# Patient Record
Sex: Female | Born: 1965 | Race: White | Hispanic: No | Marital: Married | Smoking: Never smoker
Health system: Southern US, Community
[De-identification: ages and names within clinical notes are randomized; demographics above are authoritative.]

## PROBLEM LIST (undated history)

## (undated) DIAGNOSIS — Q825 Congenital non-neoplastic nevus: Secondary | ICD-10-CM

## (undated) DIAGNOSIS — Q898 Other specified congenital malformations: Secondary | ICD-10-CM

## (undated) HISTORY — DX: Other specified congenital malformations: Q89.8

## (undated) HISTORY — DX: Congenital non-neoplastic nevus: Q82.5

---

## 1997-06-04 ENCOUNTER — Inpatient Hospital Stay (HOSPITAL_COMMUNITY): Admission: AD | Admit: 1997-06-04 | Discharge: 1997-06-05 | Payer: Self-pay | Admitting: Obstetrics and Gynecology

## 1997-06-08 ENCOUNTER — Inpatient Hospital Stay (HOSPITAL_COMMUNITY): Admission: AD | Admit: 1997-06-08 | Discharge: 1997-06-10 | Payer: Self-pay | Admitting: Obstetrics & Gynecology

## 1997-06-10 ENCOUNTER — Encounter: Admission: RE | Admit: 1997-06-10 | Discharge: 1997-09-08 | Payer: Self-pay | Admitting: Obstetrics & Gynecology

## 1997-09-08 ENCOUNTER — Encounter (HOSPITAL_COMMUNITY): Admission: RE | Admit: 1997-09-08 | Discharge: 1997-12-07 | Payer: Self-pay | Admitting: *Deleted

## 1997-12-20 ENCOUNTER — Encounter (HOSPITAL_COMMUNITY): Admission: RE | Admit: 1997-12-20 | Discharge: 1998-01-16 | Payer: Self-pay | Admitting: *Deleted

## 1998-01-23 ENCOUNTER — Other Ambulatory Visit: Admission: RE | Admit: 1998-01-23 | Discharge: 1998-01-23 | Payer: Self-pay | Admitting: Obstetrics and Gynecology

## 1998-12-04 ENCOUNTER — Emergency Department (HOSPITAL_COMMUNITY): Admission: EM | Admit: 1998-12-04 | Discharge: 1998-12-04 | Payer: Self-pay | Admitting: Emergency Medicine

## 1998-12-07 ENCOUNTER — Encounter (HOSPITAL_COMMUNITY): Admission: RE | Admit: 1998-12-07 | Discharge: 1998-12-11 | Payer: Self-pay | Admitting: Emergency Medicine

## 1998-12-18 ENCOUNTER — Encounter: Admission: RE | Admit: 1998-12-18 | Discharge: 1999-01-01 | Payer: Self-pay | Admitting: Emergency Medicine

## 2000-02-22 ENCOUNTER — Other Ambulatory Visit: Admission: RE | Admit: 2000-02-22 | Discharge: 2000-02-22 | Payer: Self-pay | Admitting: *Deleted

## 2001-06-29 ENCOUNTER — Other Ambulatory Visit: Admission: RE | Admit: 2001-06-29 | Discharge: 2001-06-29 | Payer: Self-pay | Admitting: *Deleted

## 2001-07-27 ENCOUNTER — Encounter: Payer: Self-pay | Admitting: Family Medicine

## 2001-07-27 ENCOUNTER — Ambulatory Visit (HOSPITAL_COMMUNITY): Admission: RE | Admit: 2001-07-27 | Discharge: 2001-07-27 | Payer: Self-pay | Admitting: Family Medicine

## 2002-08-17 ENCOUNTER — Other Ambulatory Visit: Admission: RE | Admit: 2002-08-17 | Discharge: 2002-08-17 | Payer: Self-pay | Admitting: *Deleted

## 2003-01-08 ENCOUNTER — Emergency Department (HOSPITAL_COMMUNITY): Admission: EM | Admit: 2003-01-08 | Discharge: 2003-01-08 | Payer: Self-pay | Admitting: Emergency Medicine

## 2003-01-08 ENCOUNTER — Encounter: Payer: Self-pay | Admitting: Emergency Medicine

## 2004-07-09 ENCOUNTER — Ambulatory Visit (HOSPITAL_COMMUNITY): Admission: RE | Admit: 2004-07-09 | Discharge: 2004-07-09 | Payer: Self-pay | Admitting: Obstetrics

## 2005-05-16 ENCOUNTER — Emergency Department (HOSPITAL_COMMUNITY): Admission: EM | Admit: 2005-05-16 | Discharge: 2005-05-16 | Payer: Self-pay | Admitting: Emergency Medicine

## 2006-06-18 ENCOUNTER — Other Ambulatory Visit: Admission: RE | Admit: 2006-06-18 | Discharge: 2006-06-18 | Payer: Self-pay | Admitting: Obstetrics and Gynecology

## 2006-08-12 ENCOUNTER — Ambulatory Visit (HOSPITAL_BASED_OUTPATIENT_CLINIC_OR_DEPARTMENT_OTHER): Admission: RE | Admit: 2006-08-12 | Discharge: 2006-08-12 | Payer: Self-pay | Admitting: Obstetrics and Gynecology

## 2006-08-12 ENCOUNTER — Encounter (INDEPENDENT_AMBULATORY_CARE_PROVIDER_SITE_OTHER): Payer: Self-pay | Admitting: Specialist

## 2006-08-12 HISTORY — PX: HYSTEROSCOPY: SHX211

## 2007-06-26 ENCOUNTER — Other Ambulatory Visit: Admission: RE | Admit: 2007-06-26 | Discharge: 2007-06-26 | Payer: Self-pay | Admitting: Obstetrics and Gynecology

## 2007-07-09 ENCOUNTER — Ambulatory Visit (HOSPITAL_COMMUNITY): Admission: RE | Admit: 2007-07-09 | Discharge: 2007-07-09 | Payer: Self-pay | Admitting: Obstetrics and Gynecology

## 2008-06-29 ENCOUNTER — Other Ambulatory Visit: Admission: RE | Admit: 2008-06-29 | Discharge: 2008-06-29 | Payer: Self-pay | Admitting: Obstetrics and Gynecology

## 2008-08-18 ENCOUNTER — Ambulatory Visit (HOSPITAL_COMMUNITY): Admission: RE | Admit: 2008-08-18 | Discharge: 2008-08-18 | Payer: Self-pay | Admitting: Obstetrics and Gynecology

## 2009-09-15 ENCOUNTER — Ambulatory Visit (HOSPITAL_COMMUNITY): Admission: RE | Admit: 2009-09-15 | Discharge: 2009-09-15 | Payer: Self-pay | Admitting: Obstetrics and Gynecology

## 2009-10-22 ENCOUNTER — Ambulatory Visit: Payer: Self-pay | Admitting: Vascular Surgery

## 2009-10-22 ENCOUNTER — Ambulatory Visit (HOSPITAL_COMMUNITY): Admission: RE | Admit: 2009-10-22 | Discharge: 2009-10-22 | Payer: Self-pay | Admitting: Emergency Medicine

## 2009-10-22 ENCOUNTER — Encounter: Payer: Self-pay | Admitting: Emergency Medicine

## 2009-12-01 ENCOUNTER — Ambulatory Visit: Payer: Self-pay | Admitting: Vascular Surgery

## 2010-09-04 NOTE — Consult Note (Signed)
NEW PATIENT CONSULTATION   Whitney Flynn, Whitney Flynn  DOB:  [birth year]                                       12/01/2009  EAVWU#:[id number]   The patient presents today for evaluation of left leg swelling.  She is  a very pleasant 45 year old white female with a lifelong history of port  wine stain, more so on her left side than on her right side.  She had  some evaluation over time and has always had some swelling in her left  leg versus her right leg.  She has never had any MRI or other studies  for further evaluation of potential AV malformations.  She does not have  any evidence externally of any AV malformations.  She noted over the  past month and a half to have more pain and swelling than had been  present in the past on her left leg.  She underwent a duplex evaluation  on 10/22/2009 and this showed no evidence of DVT.  She was diagnosed  with a ruptured left Baker's cyst.  She has had some persistent of  discomfort and persistent swelling and is seen for further evaluation of  this.  She does not have any prior history of DVT.   PAST MEDICAL HISTORY:  Otherwise completely unremarkable.  She is on no  medications, no known drug allergies.  Hospitalizations only for  childbirth.  She has had no past surgery.   FAMILY HISTORY:  Negative for any premature atherosclerotic disease.   REVIEW OF SYSTEMS:  Does have muscular pain, swelling in her left leg.  Otherwise completely normal with no weight loss or gain.  Weight is 148  pounds.  She is 5 feet 7 inches tall.   PHYSICAL EXAMINATION:  General:  Well-developed, well-nourished white  female appearing her stated age of 76.  Vital signs:  Blood pressure is  127/8,1 pulse 77, respirations, 18.  HEENT is normal.  Her radial and  dorsalis pedis pulses are 2+ bilaterally.  She does not have any pitting  edema.  She does not have any swelling in her right leg with slight port  wine staining.  On the left she has much more  extensive port wine  staining and does have total leg swelling as compared to the right.  Neurologic:  No focal weakness or paresthesias.   I discussed the significance of this with the patient.  I explained that  with the negative DVT study that her swelling and would not have any  dangerous etiology.  I expect this all very well may be related to  continued residual from the ruptured Baker's cyst.  I did explain the  importance of compression garments for lifelong control of her swelling.  She has not worn these in the past.  We have fitted her today for knee-  high 20 mm to 30 mmHg gradient compression garments and instructed her  on the use of these.  She will see [place] again on an as-needed basis.     Larina Earthly, M.D.  Electronically Signed   TFE/MEDQ  D:  12/01/2009  T:  12/04/2009  Job:  [id number]   cc:   Otho Darner, M.D.  [place] Cleta Alberts, M.D.

## 2010-09-07 NOTE — Op Note (Signed)
NAMEAAVA, DELAND                ACCOUNT NO.:  [id number]   MEDICAL RECORD NO.:  [id number]          PATIENT TYPE:  AMB   LOCATION:  [facility]                         FACILITY:  [facility]   PHYSICIAN:  Cynthia P. Romine, M.D.DATE OF BIRTH:  [birth year]   DATE OF PROCEDURE:  DATE OF DISCHARGE:                               OPERATIVE REPORT   PREOPERATIVE DIAGNOSES:  Menorrhagia, dysmenorrhea and known endometrial  polyps.   POSTOPERATIVE DIAGNOSES:  Menorrhagia, dysmenorrhea and known  endometrial polyps.   PATHOLOGY:  Pending.   PROCEDURE:  1. Hysteroscopic resection of endometrial polyps.  2. Diltation and curettage.   SURGEON:  Cynthia P. Romine, M.D.   ASSISTANT:  Lum Keas, MD   ANESTHESIA:  General by LMA plus paracervical block.   ESTIMATED BLOOD LOSS:  Minimal.   COMPLICATIONS:  None.   SORBITOL DEFICIT:  75 cc.   PROCEDURE IN DETAIL:  The patient was taken to the operating room and  after induction of adequate general anesthesia, she was placed in dorsal  lithotomy position and prepped and draped in the usual fashion.  A  posterior weighted  and anterior Sims retractor were placed.  The cervix  was grasped at the anterior lip with single tooth tenaculum.  Paracervical block was instituted by injected 10 cc 1% xylocaine at each  of 3 and 9 o'clock at the cervix.  The uterus then sounded to 9 cm.  The  cervix was dilated to a #31 Pratt. The operative hysteroscope was  introduced. There were multiple endometrial polyps in the cavity  consistent with her previous sonohysterogram.  The single loupe  hysteroscope was used to resect the endometrial polyps. Sorbitol was  used as the distention medium.  There were no complications.  Upon  withdrawal of the hysteroscope, sharp curettage was gently carried out  and a specimen was sent with the endometrial polyps.  Hysteroscopy was  again done. The cavity appeared clean  and smooth.  Photographic documentation was taken.   The scope was  withdrawn.  The instruments were removed from the vagina and the  procedure was terminated.  The patient tolerated it well and went in  satisfactory condition to post-anesthesia recovery.      Cynthia P. Romine, M.D.  Electronically Signed     CPR/MEDQ  D:  08/12/2006  T:  08/12/2006  Job:  [id number]   cc:   Aram Beecham P. Romine, M.D.  Fax: [phone]

## 2010-10-30 ENCOUNTER — Other Ambulatory Visit: Payer: Self-pay | Admitting: Internal Medicine

## 2010-10-30 DIAGNOSIS — R1907 Generalized intra-abdominal and pelvic swelling, mass and lump: Secondary | ICD-10-CM

## 2010-10-31 ENCOUNTER — Ambulatory Visit
Admission: RE | Admit: 2010-10-31 | Discharge: 2010-10-31 | Disposition: A | Discharge status: Home / Self Care | Payer: BC Managed Care – PPO | Source: Ambulatory Visit | Attending: Internal Medicine | Admitting: Internal Medicine

## 2010-10-31 ENCOUNTER — Other Ambulatory Visit: Payer: Self-pay

## 2010-10-31 DIAGNOSIS — R1907 Generalized intra-abdominal and pelvic swelling, mass and lump: Secondary | ICD-10-CM

## 2010-10-31 MED ORDER — IOHEXOL 300 MG/ML  SOLN: 100.0000 mL | Freq: Once | INTRAMUSCULAR | Status: AC | PRN

## 2010-11-12 ENCOUNTER — Other Ambulatory Visit: Payer: Self-pay | Admitting: Obstetrics and Gynecology

## 2010-11-12 DIAGNOSIS — Z1231 Encounter for screening mammogram for malignant neoplasm of breast: Secondary | ICD-10-CM

## 2010-11-22 ENCOUNTER — Ambulatory Visit (HOSPITAL_COMMUNITY)
Admission: RE | Admit: 2010-11-22 | Discharge: 2010-11-22 | Disposition: A | Discharge status: Home / Self Care | Payer: BC Managed Care – PPO | Source: Ambulatory Visit | Attending: Obstetrics and Gynecology | Admitting: Obstetrics and Gynecology

## 2010-11-22 DIAGNOSIS — Z1231 Encounter for screening mammogram for malignant neoplasm of breast: Secondary | ICD-10-CM

## 2011-04-05 ENCOUNTER — Ambulatory Visit (INDEPENDENT_AMBULATORY_CARE_PROVIDER_SITE_OTHER): Payer: BC Managed Care – PPO

## 2011-04-05 DIAGNOSIS — J019 Acute sinusitis, unspecified: Secondary | ICD-10-CM

## 2012-04-29 ENCOUNTER — Other Ambulatory Visit: Payer: Self-pay | Admitting: Obstetrics and Gynecology

## 2012-04-29 DIAGNOSIS — Z1231 Encounter for screening mammogram for malignant neoplasm of breast: Secondary | ICD-10-CM

## 2012-05-08 ENCOUNTER — Ambulatory Visit (HOSPITAL_COMMUNITY)
Admission: RE | Admit: 2012-05-08 | Discharge: 2012-05-08 | Disposition: A | Discharge status: Home / Self Care | Payer: BC Managed Care – PPO | Source: Ambulatory Visit | Attending: Obstetrics and Gynecology | Admitting: Obstetrics and Gynecology

## 2012-05-08 DIAGNOSIS — Z1231 Encounter for screening mammogram for malignant neoplasm of breast: Secondary | ICD-10-CM | POA: Insufficient documentation

## 2012-12-09 IMAGING — CT CT ABD-PELV W/ CM
2 of 5 series · 17 of 46 positions shown, 19 images · IV contrast (READICAT/WATER & [ID] OMNI [id number])
Comparison: None

CLINICAL DATA: Abdominal and left leg swelling.

CT ABDOMEN AND PELVIS WITH CONTRAST
TECHNIQUE: Multidetector CT imaging of the abdomen and pelvis was
performed following the standard protocol during bolus
administration of intravenous contrast.
Contrast: 100 ml Lmnipaque-TMM

[Series 2: abdomen w/ · axial · 0.75mm/px · z∈[-351,+39]mm · 14 of 87 slices shown, 16 images]
[im 5/87  soft-tissue]
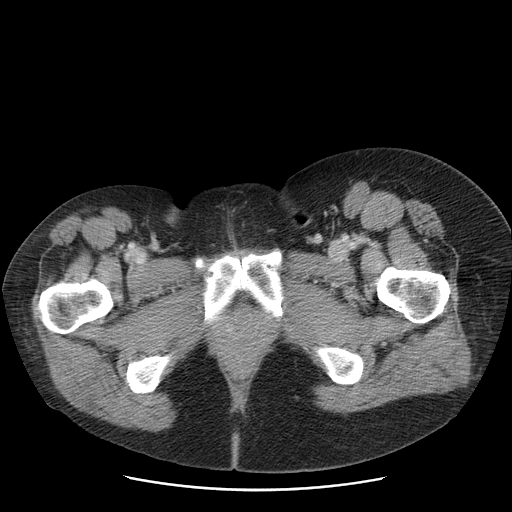
[im 5/87  bone]
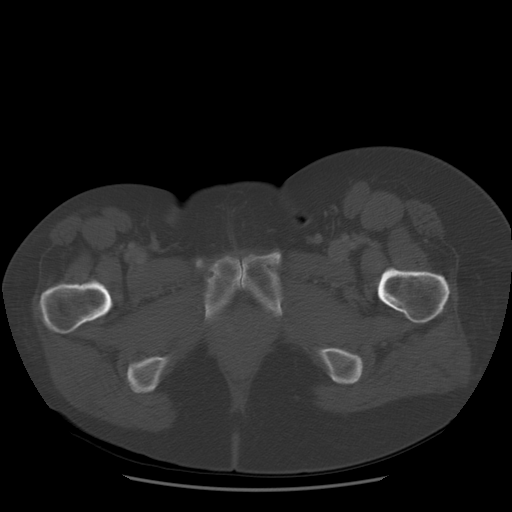
[im 10/87  soft-tissue]
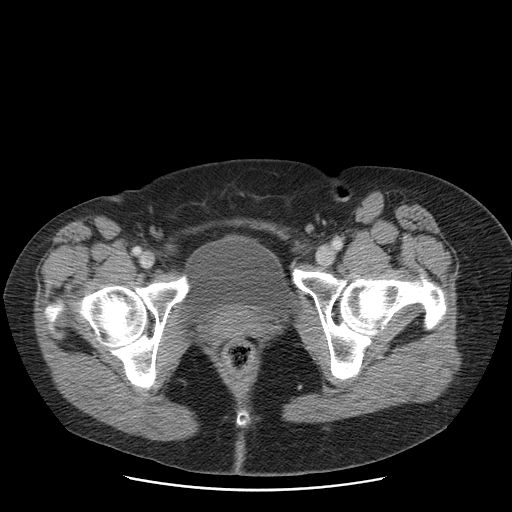
[im 19/87  soft-tissue]
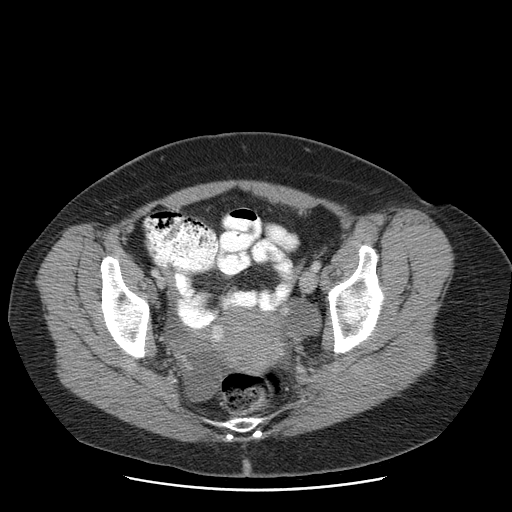
[im 23/87  soft-tissue]
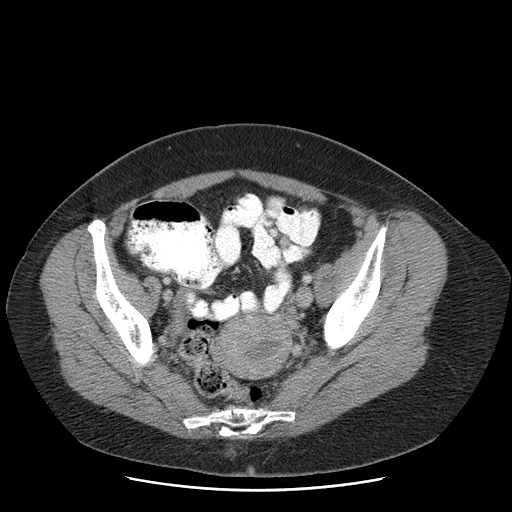
[im 28/87  soft-tissue]
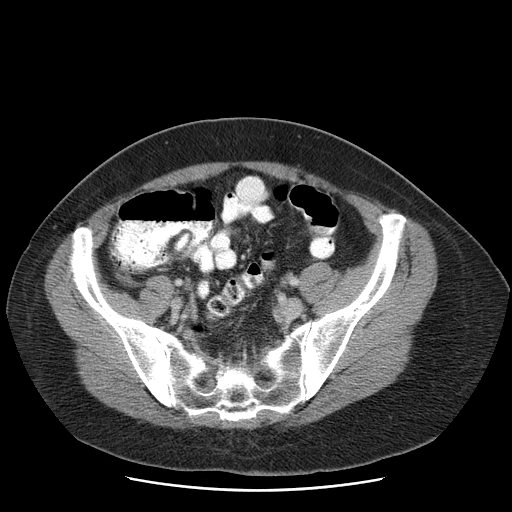
[im 37/87  soft-tissue]
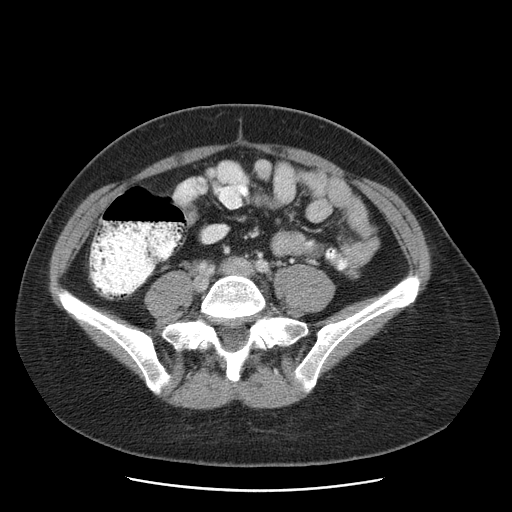
[im 41/87  soft-tissue]
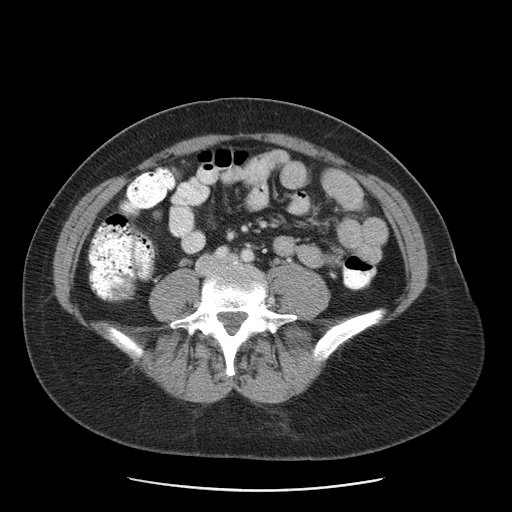
[im 46/87  soft-tissue]
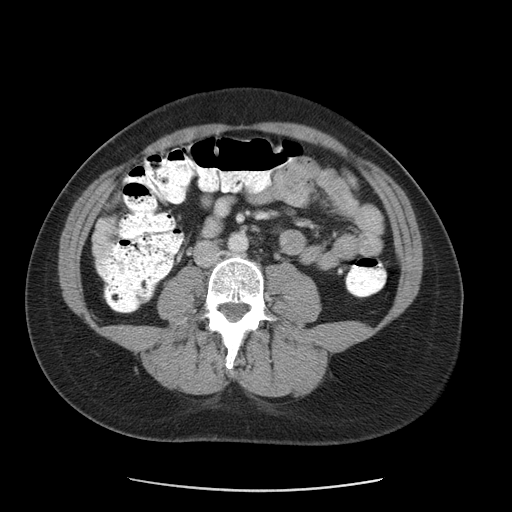
[im 50/87  soft-tissue]
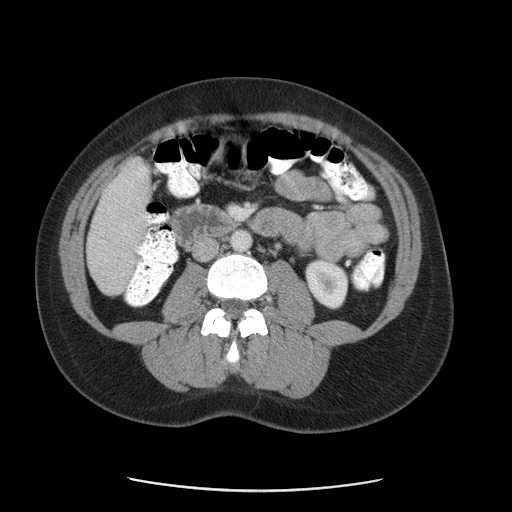
[im 50/87  bone]
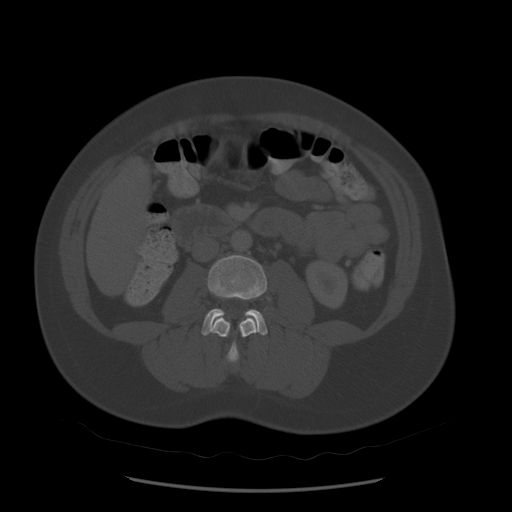
[im 59/87  soft-tissue]
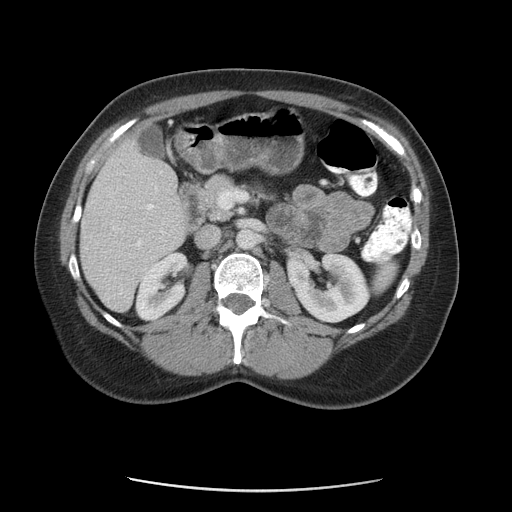
[im 64/87  soft-tissue]
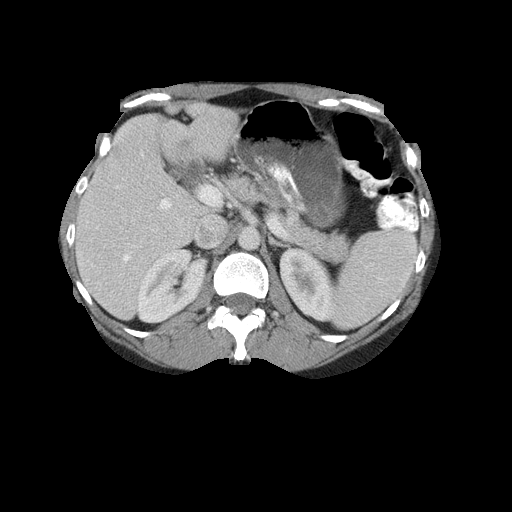
[im 68/87  soft-tissue]
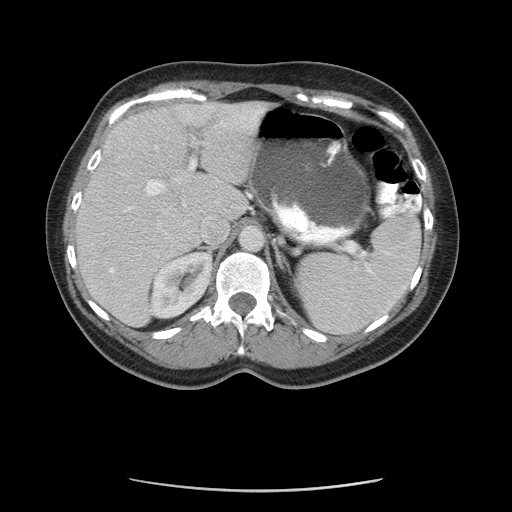
[im 77/87  soft-tissue]
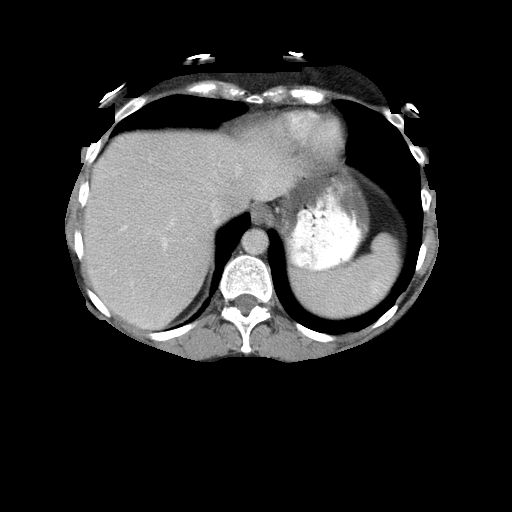
[im 82/87  soft-tissue]
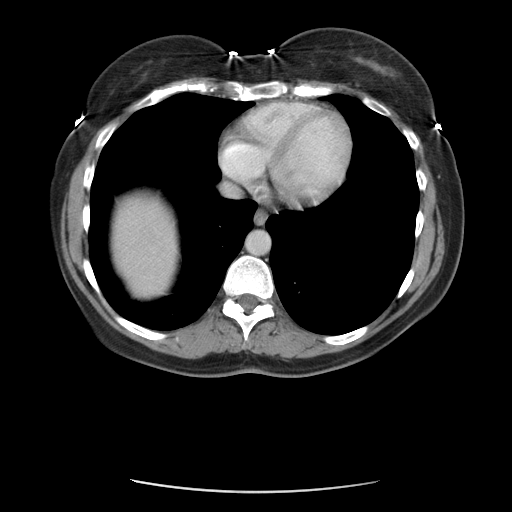

[Series 400: cor · coronal · 0.90mm/px · 3 of 125 slices shown]
[im 42/125  soft-tissue]
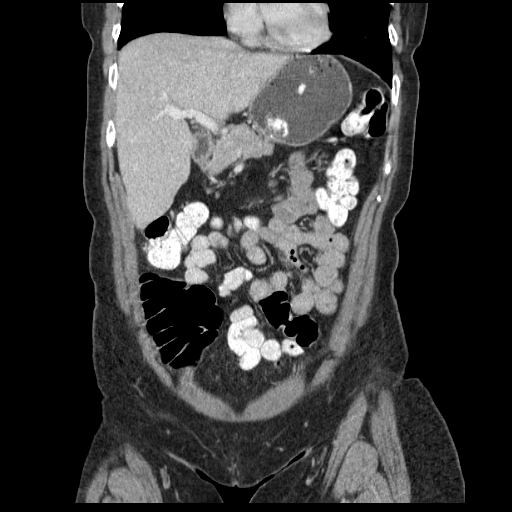
[im 56/125  soft-tissue]
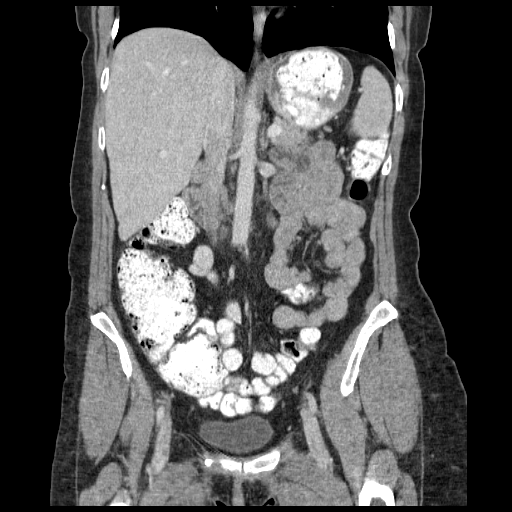
[im 69/125  soft-tissue]
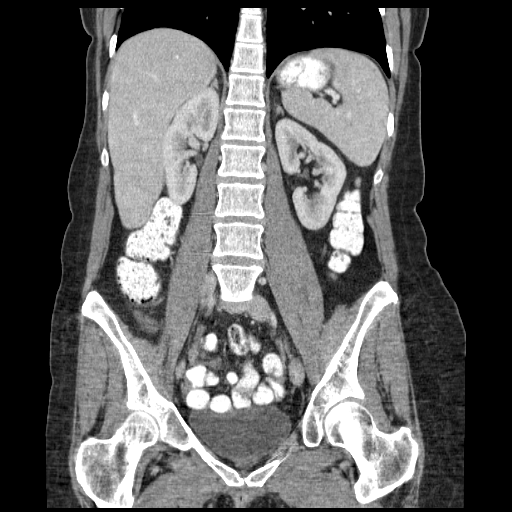

[17 of 46 positions shown; findings below may reference images not displayed]

FINDINGS: Lung bases are clear.  No pleural or pericardial fluid.
The liver parenchyma is normal except for a well-circumscribed low
density posteriorly in the right lobe measuring 4 x 9 mm in size.
This is likely represent a cyst or hemangioma.  No calcified
gallstones.  No ductal dilatation.  The spleen is normal.  The
pancreas is normal.  The adrenal glands are normal.  The kidneys
are normal.  The aorta is normal.  The IVC is normal.  There are no
enlarged retroperitoneal lymph nodes or retroperitoneal masses.
There is no free intraperitoneal fluid within the abdomen.  In the
pelvis, there is a moderate amount of free fluid.  The uterus
appears normal.  No ovarian lesion is discernible.  No bowel
pathology is seen.  No venous compressive lesion is seen.
IMPRESSION: No cause of abdominal swelling or left leg swelling is identified.

4 x 9 mm low density in the right lobe of the liver likely to
represent a cyst or hemangioma.

Moderate amount of free fluid in the pelvis.  No other pathologic
feature.  In a premenopausal person without pelvic symptoms, this
is likely to be physiologic.

## 2012-12-30 ENCOUNTER — Ambulatory Visit: Payer: BC Managed Care – PPO | Admitting: Obstetrics and Gynecology

## 2012-12-31 ENCOUNTER — Ambulatory Visit: Payer: BC Managed Care – PPO | Admitting: Obstetrics and Gynecology

## 2013-01-20 ENCOUNTER — Encounter: Payer: Self-pay | Admitting: Obstetrics and Gynecology

## 2013-01-20 ENCOUNTER — Ambulatory Visit (INDEPENDENT_AMBULATORY_CARE_PROVIDER_SITE_OTHER): Payer: BC Managed Care – PPO | Admitting: Obstetrics and Gynecology

## 2013-01-20 VITALS — BP 120/74 | HR 76 | Ht 67.5 in | Wt 159.5 lb

## 2013-01-20 DIAGNOSIS — Z01419 Encounter for gynecological examination (general) (routine) without abnormal findings: Secondary | ICD-10-CM

## 2013-01-20 DIAGNOSIS — Z0000 Encounter for general adult medical examination without abnormal findings: Secondary | ICD-10-CM

## 2013-01-20 DIAGNOSIS — R319 Hematuria, unspecified: Secondary | ICD-10-CM

## 2013-01-20 LAB — CBC
HCT: 36.8 % (ref 36.0–46.0)
MCH: 29.8 pg (ref 26.0–34.0)
MCV: 84.4 fL (ref 78.0–100.0)
RDW: 14.7 % (ref 11.5–15.5)
WBC: 5.1 10*3/uL (ref 4.0–10.5)

## 2013-01-20 NOTE — Patient Instructions (Signed)

## 2013-01-20 NOTE — Progress Notes (Signed)
Patient ID: Whitney Flynn, female   DOB: Oct 15, 1965, 47 y.o.   MRN: [id number] GYNECOLOGY VISIT  PCP:  [facility]  Referring provider:   HPI: 47 y.o.   Married  Caucasian  female   No obstetric history on file. with Patient's last menstrual period was 12/11/2012.   here for   AEX. Wondering where she is in the menopause scheme.  Older sister and mother had hysterectomy so she cannot compare to others. Had irregular menses.  Occur every every three - four weeks.  Lasts 3 - 5 days.  Sometimes has cramping. Has severe headache at the beginning and the end.   Has optic migraines twice a year.   Can manage them with ibuprofen.  Takes two every four hours.  This summer had hot flashes.  Not waking patient at night. Very short lived.   Has a left sided Baker's cyst and has fluid retention on the left side, especially premenses.   Wants to do fasting blood work.   Hgb:  13.0 Urine: 1+ RBC's (asymptomatic) - due for menses.   GYNECOLOGIC HISTORY: Patient's last menstrual period was 12/11/2012. Sexually active:  yes Partner preference: female Contraception:  vasectomy  Menopausal hormone therapy:  DES exposure:  no Blood transfusions:  no Sexually transmitted diseases:   no GYN Procedures:  Hysteroscopy with D & C and polypectomy 2008 Mammogram:  05-08-12 wnl:[facility]               Pap: 07-18-10 wnl:no HR HPV done.   History of abnormal pap smear:  no   OB History   Grav Para Term Preterm Abortions TAB SAB Ect Mult Living                   LIFESTYLE: Exercise:   Moderate walking/hiking            Tobacco:   no Alcohol:      no Drug use:   no  OTHER HEALTH MAINTENANCE: Tetanus/TDap:  2008 Gardisil:  NA Influenza:  never Zostavax:  NA  Bone density:  n/a Colonoscopy:  n/a  Cholesterol check: wnl  No family history on file.  There are no active problems to display for this patient.  No past medical history on file.  No past surgical history  on file.  ALLERGIES: Review of patient's allergies indicates not on file.  No current outpatient prescriptions on file.   No current facility-administered medications for this visit.     ROS:  Pertinent items are noted in HPI.  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   PHYSICAL EXAMINATION:    BP 120/74  Pulse 76  Ht 5' 7.5" (1.715 m)  Wt 159 lb 8 oz (72.349 kg)  BMI 24.6 kg/m2  LMP 12/11/2012   Wt Readings from Last 3 Encounters:  01/20/13 159 lb 8 oz (72.349 kg)     Ht Readings from Last 3 Encounters:  01/20/13 5' 7.5" (1.715 m)    General appearance: alert, cooperative and appears stated age Head: Normocephalic, without obvious abnormality, atraumatic Neck: no adenopathy, supple, symmetrical, trachea midline and thyroid not enlarged, symmetric, no tenderness/mass/nodules Lungs: clear to auscultation bilaterally Breasts: Inspection negative, No nipple retraction or dimpling, No nipple discharge or bleeding, No axillary or supraclavicular adenopathy, Normal to palpation without dominant masses Heart: regular rate and rhythm Abdomen: soft, non-tender; no masses,  no organomegaly Extremities: extremities normal, atraumatic, no cyanosis or edema  Skin: Skin color, texture, turgor normal. No rashes or lesions Lymph nodes: Cervical, supraclavicular, and axillary nodes normal. No abnormal inguinal nodes palpated Neurologic: Grossly normal  Pelvic: External genitalia:  no lesions              Urethra:  normal appearing urethra with no masses, tenderness or lesions              Bartholins and Skenes: normal                 Vagina: normal appearing vagina with normal color and discharge, no lesions              Cervix: normal appearance              Pap and high risk HPV testing done: yes.            Bimanual Exam:  Uterus:  uterus is normal size, shape, consistency and nontender                                       Adnexa: normal adnexa in size, nontender and no masses                                      Rectovaginal: Confirms                                      Anus:  normal sphincter tone, no lesions  ASSESSMENT  Normal gynecologic exam. Perimenopausal female. Menstrual migraines.  Hematuria.  PLAN  Mammogram in January 2015. Pap smear and high risk HPV testing done. Counseled on menstrual migraines.  Not ideal candidate for low dose combined OCPS.  I discussed Mirena. Return for Lipid profile, CMP. Will do CBC from blood already done today as it does not need to be fasting. Send urinalysis micro and culture.  Return annually or prn   An After Visit Summary was printed and given to the patient.

## 2013-01-20 NOTE — Addendum Note (Signed)
Addended by: Alphonsa Overall on: 01/20/2013 01:06 PM   Modules accepted: Orders

## 2013-01-21 LAB — CULTURE, URINE COMPREHENSIVE
Colony Count: NO GROWTH
Organism ID, Bacteria: NO GROWTH

## 2013-01-21 LAB — URINALYSIS, MICROSCOPIC ONLY: Casts: NONE SEEN

## 2013-01-22 LAB — IPS PAP TEST WITH HPV

## 2013-01-24 ENCOUNTER — Other Ambulatory Visit: Payer: Self-pay | Admitting: Obstetrics and Gynecology

## 2013-01-24 DIAGNOSIS — R3129 Other microscopic hematuria: Secondary | ICD-10-CM

## 2013-01-28 ENCOUNTER — Telehealth: Payer: Self-pay

## 2013-01-28 NOTE — Telephone Encounter (Signed)
Patient notified do not need to repeat urine test.

## 2013-01-28 NOTE — Telephone Encounter (Signed)
Patient called and discussed lab results.  Advised needs fasting lipid profile and CMP.  Patient states she is having fasting labs for [employer] on 02-03-13 and thinks they will be checking all of this.  If they don't she will come in for these labs.  Patient states She did start her menses day after her office visit and feels sure this is why she had RBC's in her urine.  Does she still need to get A repeat urine?  Will discuss with Dr. Edward Jolly and pt. States if she does she will do urine when drops off labs.

## 2013-01-28 NOTE — Telephone Encounter (Signed)
No need to repeat the urine.

## 2013-02-25 ENCOUNTER — Other Ambulatory Visit: Payer: Self-pay

## 2013-04-13 ENCOUNTER — Other Ambulatory Visit (HOSPITAL_COMMUNITY): Payer: Self-pay | Admitting: *Deleted

## 2013-04-13 DIAGNOSIS — Z1231 Encounter for screening mammogram for malignant neoplasm of breast: Secondary | ICD-10-CM

## 2013-05-12 ENCOUNTER — Ambulatory Visit (HOSPITAL_COMMUNITY)
Admission: RE | Admit: 2013-05-12 | Discharge: 2013-05-12 | Disposition: A | Discharge status: Home / Self Care | Payer: BC Managed Care – PPO | Source: Ambulatory Visit | Attending: Obstetrics and Gynecology | Admitting: Obstetrics and Gynecology

## 2013-05-12 DIAGNOSIS — Z1231 Encounter for screening mammogram for malignant neoplasm of breast: Secondary | ICD-10-CM | POA: Insufficient documentation

## 2013-12-15 ENCOUNTER — Encounter: Payer: Self-pay | Admitting: Obstetrics and Gynecology

## 2014-01-21 ENCOUNTER — Ambulatory Visit (INDEPENDENT_AMBULATORY_CARE_PROVIDER_SITE_OTHER): Payer: BC Managed Care – PPO | Admitting: Obstetrics and Gynecology

## 2014-01-21 ENCOUNTER — Encounter: Payer: Self-pay | Admitting: Obstetrics and Gynecology

## 2014-01-21 ENCOUNTER — Ambulatory Visit: Payer: BC Managed Care – PPO | Admitting: Obstetrics and Gynecology

## 2014-01-21 VITALS — BP 126/78 | HR 72 | Ht 67.5 in | Wt 159.0 lb

## 2014-01-21 DIAGNOSIS — Z0000 Encounter for general adult medical examination without abnormal findings: Secondary | ICD-10-CM

## 2014-01-21 DIAGNOSIS — Z01419 Encounter for gynecological examination (general) (routine) without abnormal findings: Secondary | ICD-10-CM

## 2014-01-21 DIAGNOSIS — R312 Other microscopic hematuria: Secondary | ICD-10-CM

## 2014-01-21 DIAGNOSIS — R3129 Other microscopic hematuria: Secondary | ICD-10-CM

## 2014-01-21 DIAGNOSIS — N951 Menopausal and female climacteric states: Secondary | ICD-10-CM

## 2014-01-21 LAB — HEMOGLOBIN, FINGERSTICK: HEMOGLOBIN, FINGERSTICK: 13.5 g/dL (ref 12.0–16.0)

## 2014-01-21 LAB — POCT URINALYSIS DIPSTICK
Bilirubin, UA: NEGATIVE
GLUCOSE UA: NEGATIVE
KETONES UA: NEGATIVE
Leukocytes, UA: NEGATIVE
Nitrite, UA: NEGATIVE
Protein, UA: NEGATIVE
UROBILINOGEN UA: NEGATIVE
pH, UA: 5

## 2014-01-21 LAB — TSH: TSH: 1.591 u[IU]/mL (ref 0.350–4.500)

## 2014-01-21 NOTE — Progress Notes (Signed)
GYNECOLOGY VISIT  PCP:   Referring provider:   HPI: 48 y.o.   Married  Caucasian  female   G2P2002 with Patient's last menstrual period was 11/04/2013.   here for   Annual Having hot flashes and has skipped cycles.   Hgb:  13.5 Urine:  RBC-Large, ph: 5.0  GYNECOLOGIC HISTORY: Patient's last menstrual period was 11/04/2013. Sexually active:  yes Partner preference: female Contraception:   vasectomy Menopausal hormone therapy: none DES exposure:   none Blood transfusions:   no Sexually transmitted diseases:no    GYN procedures and prior surgeries: Hysteroscopy with D & C and polypectomy, 2008   Last mammogram:     05/12/13  Bi-Rads Neg         Last pap and high risk HPV testing:  01/20/13, pos ASCUS, neg HR HPV  History of abnormal pap smear:  Yes, year ago   OB History   Grav Para Term Preterm Abortions TAB SAB Ect Mult Living   2 2 2       2        LIFESTYLE: Exercise:  Walking and hiking about 3 hours a week         OTHER HEALTH MAINTENANCE: Tetanus/TDap: 2008 HPV: no Influenza:no     Bone density: no Colonoscopy: no  Cholesterol check: unsure  Family History  Problem Relation Age of Onset  . Thyroid disease Mother   . Colon cancer Father     There are no active problems to display for this patient.  Past Medical History  Diagnosis Date  . Hemihypertrophy   . Port wine stain     Past Surgical History  Procedure Laterality Date  . Hysteroscopy  08-12-06    D & C--resection polyps--benign--Dr. Tresa Resomine    ALLERGIES: Review of patient's allergies indicates no known allergies.  Current Outpatient Prescriptions  Medication Sig Dispense Refill  . Calcium Citrate-Vitamin D (CALCITRATE/VITAMIN D PO) Take by mouth.       No current facility-administered medications for this visit.     ROS:  Pertinent items are noted in HPI.  History   Social History  . Marital Status: Married    Spouse Name: N/A    Number of Children: N/A  . Years of Education: N/A    Occupational History  . Not on file.   Social History Main Topics  . Smoking status: Never Smoker   . Smokeless tobacco: Not on file  . Alcohol Use: No  . Drug Use: No  . Sexual Activity: Yes    Partners: Male    Birth Control/ Protection: Other-see comments     Comment: vasectomy   Other Topics Concern  . Not on file   Social History Narrative  . No narrative on file    PHYSICAL EXAMINATION:    BP 126/78  Pulse 72  Ht 5' 7.5" (1.715 m)  Wt 159 lb (72.122 kg)  BMI 24.52 kg/m2  LMP 11/04/2013   Wt Readings from Last 3 Encounters:  01/21/14 159 lb (72.122 kg)  01/20/13 159 lb 8 oz (72.349 kg)     Ht Readings from Last 3 Encounters:  01/21/14 5' 7.5" (1.715 m)  01/20/13 5' 7.5" (1.715 m)    General appearance: alert, cooperative and appears stated age Head: Normocephalic, without obvious abnormality, atraumatic Neck: no adenopathy, supple, symmetrical, trachea midline and thyroid not enlarged, symmetric, no tenderness/mass/nodules Lungs: clear to auscultation bilaterally Breasts: Inspection negative, No nipple retraction or dimpling, No nipple discharge or bleeding, No axillary or supraclavicular  adenopathy, Normal to palpation without dominant masses Heart: regular rate and rhythm Abdomen: soft, non-tender; no masses,  no organomegaly Extremities: extremities normal, atraumatic, no cyanosis or edema Skin: Skin color, texture, turgor normal. No rashes or lesions Lymph nodes: Cervical, supraclavicular, and axillary nodes normal. No abnormal inguinal nodes palpated Neurologic: Grossly normal  Pelvic: External genitalia:  no lesions              Urethra:  normal appearing urethra with no masses, tenderness or lesions              Bartholins and Skenes: normal                 Vagina: normal appearing vagina with normal color and discharge, no lesions              Cervix: normal appearance              Pap and high risk HPV testing done: Yes.          Bimanual  Exam:  Uterus:  uterus is normal size, shape, consistency and nontender                                      Adnexa: normal adnexa in size, nontender and no masses                                      Rectovaginal:  Yes.                                        Confirms above.                                      Anus:  normal sphincter tone, no lesions  ASSESSMENT  Normal gynecologic exam. Microscopic hematuria.  History of ASCUS and negative HR HPV.  Menopausal symptoms.  PLAN  Mammogram recommended yearly starting at age 25. Pap smear and high risk HPV testing as above. Counseled on self breast exam, Calcium and vitamin D intake, exercise. See lab orders: Yes.   Urine micro and culture.  Referral to urology.  Return annually or prn   An After Visit Summary was printed and given to the patient.

## 2014-01-21 NOTE — Patient Instructions (Signed)

## 2014-01-22 LAB — PROLACTIN: PROLACTIN: 8 ng/mL

## 2014-01-22 LAB — URINALYSIS, MICROSCOPIC ONLY
Bacteria, UA: NONE SEEN
CASTS: NONE SEEN
CRYSTALS: NONE SEEN
SQUAMOUS EPITHELIAL / LPF: NONE SEEN

## 2014-01-22 LAB — FOLLICLE STIMULATING HORMONE: FSH: 20.8 m[IU]/mL

## 2014-01-22 LAB — ESTRADIOL: ESTRADIOL: 52.4 pg/mL

## 2014-01-23 ENCOUNTER — Other Ambulatory Visit: Payer: Self-pay | Admitting: Obstetrics and Gynecology

## 2014-01-23 LAB — URINE CULTURE
COLONY COUNT: NO GROWTH
Organism ID, Bacteria: NO GROWTH

## 2014-01-23 MED ORDER — MEDROXYPROGESTERONE ACETATE 10 MG PO TABS: 10.0000 mg | ORAL_TABLET | Freq: Every day | ORAL | Status: DC

## 2014-01-25 LAB — IPS PAP TEST WITH HPV

## 2014-02-04 ENCOUNTER — Other Ambulatory Visit: Payer: Self-pay

## 2014-02-21 ENCOUNTER — Encounter: Payer: Self-pay | Admitting: Obstetrics and Gynecology

## 2015-02-15 ENCOUNTER — Ambulatory Visit (INDEPENDENT_AMBULATORY_CARE_PROVIDER_SITE_OTHER): Payer: BC Managed Care – PPO | Admitting: Obstetrics and Gynecology

## 2015-02-15 ENCOUNTER — Telehealth: Payer: Self-pay | Admitting: Emergency Medicine

## 2015-02-15 ENCOUNTER — Encounter: Payer: Self-pay | Admitting: Obstetrics and Gynecology

## 2015-02-15 VITALS — BP 114/64 | HR 70 | Resp 16 | Ht 67.5 in | Wt 166.0 lb

## 2015-02-15 DIAGNOSIS — Z0000 Encounter for general adult medical examination without abnormal findings: Secondary | ICD-10-CM | POA: Diagnosis not present

## 2015-02-15 DIAGNOSIS — M7989 Other specified soft tissue disorders: Secondary | ICD-10-CM | POA: Diagnosis not present

## 2015-02-15 DIAGNOSIS — R3129 Other microscopic hematuria: Secondary | ICD-10-CM | POA: Diagnosis not present

## 2015-02-15 DIAGNOSIS — Z01419 Encounter for gynecological examination (general) (routine) without abnormal findings: Secondary | ICD-10-CM

## 2015-02-15 LAB — POCT URINALYSIS DIPSTICK
Bilirubin, UA: NEGATIVE
Glucose, UA: NEGATIVE
Ketones, UA: NEGATIVE
Leukocytes, UA: NEGATIVE
Nitrite, UA: NEGATIVE
Protein, UA: NEGATIVE
Urobilinogen, UA: NEGATIVE
pH, UA: 5

## 2015-02-15 LAB — URINALYSIS, MICROSCOPIC ONLY
Bacteria, UA: NONE SEEN [HPF]
Casts: NONE SEEN [LPF]
Crystals: NONE SEEN [HPF]
Squamous Epithelial / HPF: NONE SEEN [HPF]
WBC, UA: NONE SEEN WBC/HPF
Yeast: NONE SEEN [HPF]

## 2015-02-15 NOTE — Telephone Encounter (Signed)
Patient scheduled [facility] for 02/16/15 at 1240 arrival. Patient choice for date. She declines [facility].  [facility] at [address], [place] [id number].   Detailed message left per patient request. Okay per designated party release form.

## 2015-02-15 NOTE — Patient Instructions (Signed)

## 2015-02-15 NOTE — Progress Notes (Signed)
Patient ID: Whitney BasqueLaura Flynn, female   DOB: [birth year], 49 y.o.   MRN: [id number] 49 y.o. 522P2002 Married Caucasian female here for annual exam.    Menses are every other month.  Last for 4 days.  No heavy bleeding or significant pain.  No hot flashes.  Never took Provera.   Having swelling in her left leg. Has a hx of left hemi-hyperthrophy.  Has seen a vascular specialist.  Has compression hose.   Has increased left ankle swelling and fluid retention around the time of her menses.   Has swelling behind her left knee for 3 months.  Has a Baker's cyst behind her left knee.   Busy time of year at work.   PCP:  [facility] - Dr. Merla Richesoolittle.  Will do labs with PCP.   Patient's last menstrual period was 01/05/2015 (exact date).          Sexually active: Yes.  husband  The current method of family planning is vasectomy.    Exercising: Yes.  walking and hiking.  Smoker:  no  Health Maintenance: Pap:  01-21-14 Neg:Neg HRHPV History of abnormal Pap:  no MMG:  05-12-13 Density Cat.C/Neg[facility]Rads1:[facility]  Has appointment.  Colonoscopy:  n/a BMD:   n/a  Result  n/a TDaP:  2008 Screening Labs:  Urine today: Mod. RBCs--neg.urological work-up 2015.   reports that she has never smoked. She does not have any smokeless tobacco history on file. She reports that she does not drink alcohol or use illicit drugs.  Past Medical History  Diagnosis Date  . Hemihypertrophy   . Port wine stain     Past Surgical History  Procedure Laterality Date  . Hysteroscopy  08-12-06    D & C--resection polyps--benign--Dr. Tresa Resomine    Current Outpatient Prescriptions  Medication Sig Dispense Refill  . Calcium Citrate-Vitamin D (CALCITRATE/VITAMIN D PO) Take by mouth.    Marland Kitchen. ibuprofen (ADVIL,MOTRIN) 200 MG tablet Take 200 mg by mouth every 6 (six) hours as needed.     No current facility-administered medications for this visit.    Family History  Problem Relation Age of Onset  . Thyroid  disease Mother   . Colon cancer Father   . Cancer Father [id number]    1.Prostate CA, 2.Lymphoma--related to Agent Orange    ROS:  Pertinent items are noted in HPI.  Otherwise, a comprehensive ROS was negative.  Exam:   BP 114/64 mmHg  Pulse 70  Resp 16  Ht 5' 7.5" (1.715 m)  Wt 166 lb (75.297 kg)  BMI 25.60 kg/m2  LMP 01/05/2015 (Exact Date)    General appearance: alert, cooperative and appears stated age Head: Normocephalic, without obvious abnormality, atraumatic Neck: no adenopathy, supple, symmetrical, trachea midline and thyroid normal to inspection and palpation Lungs: clear to auscultation bilaterally Breasts: normal appearance, no masses or tenderness, Inspection negative, No nipple retraction or dimpling, No nipple discharge or bleeding, No axillary or supraclavicular adenopathy Heart: regular rate and rhythm Abdomen: soft, non-tender; bowel sounds normal; no masses,  no organomegaly Extremities: extremities normal, atraumatic, edema of left lower extremity and nonspecific mass of left lateral leg near knee. Skin: Skin color, texture, turgor normal. No rashes or lesions Lymph nodes: Cervical, supraclavicular, and axillary nodes normal. No abnormal inguinal nodes palpated Neurologic: Grossly normal  Pelvic: External genitalia:  no lesions              Urethra:  normal appearing urethra with no masses, tenderness or lesions  Bartholins and Skenes: normal                 Vagina: normal appearing vagina with normal color and discharge, no lesions              Cervix: no lesions              Pap taken: No. Bimanual Exam:  Uterus:  normal size, contour, position, consistency, mobility, non-tender              Adnexa: normal adnexa and no mass, fullness, tenderness              Rectovaginal: Yes.  .  Confirms.              Anus:  normal sphincter tone, no lesions  Chaperone was present for exam.  Assessment:   Well woman visit with normal exam. Perimenopausal  female.  Chronic left lower extremity swelling and knot.  Known Baker's cyst. Enlarging? Microscopic hematuria.   Plan: Yearly mammogram recommended after age 40.  Recommended self breast exam.  Pap and HR HPV as above. Discussed Calcium, Vitamin D, regular exercise program including cardiovascular and weight bearing exercise. Labs performed.  Yes.  .   See orders.  Urine micro and culture.  Refills given on medications.  No..    Will schedule doppler ultrasound of left lower extremity.  Will follow up with PCP for chronic LE edema to complete a work up.  She agrees with this plan.  Follow up annually and prn.     After visit summary provided.

## 2015-02-16 ENCOUNTER — Ambulatory Visit
Admission: RE | Admit: 2015-02-16 | Discharge: 2015-02-16 | Disposition: A | Discharge status: Home / Self Care | Payer: BC Managed Care – PPO | Source: Ambulatory Visit | Attending: Obstetrics and Gynecology | Admitting: Obstetrics and Gynecology

## 2015-02-16 DIAGNOSIS — M7989 Other specified soft tissue disorders: Secondary | ICD-10-CM

## 2015-02-16 NOTE — Telephone Encounter (Signed)
Phone call to patient on cell.  LM with results of doppler ultrasound showing no evidence of a blood clot.  I recommended patient continue her evaluation for her swelling through her PCP.  I will also release her results through My Chart.

## 2015-02-17 LAB — URINE CULTURE: Colony Count: 65000

## 2015-03-20 ENCOUNTER — Encounter: Payer: Self-pay | Admitting: Internal Medicine

## 2016-03-06 ENCOUNTER — Ambulatory Visit: Payer: BC Managed Care – PPO | Admitting: Obstetrics and Gynecology

## 2016-04-08 NOTE — Progress Notes (Signed)
50 y.o. [id number] Married Caucasian female here for annual exam.    No menses since June.   Weight shifting.  Some hot flashes which are manageable.  Some night sweats.  Some joint soreness in the am. Increased exercise.  Has chronic LE extremity swelling.   [facility]   Patient's last menstrual period was 09/25/2015 (exact date).     Period Pattern: (!) Irregular     Sexually active: Yes.  female  The current method of family planning is vasectomy.    Exercising: Yes.    Brisk walking 5x/week for 1 hour Smoker:  no  Health Maintenance: Pap:  01-21-14 Neg:Neg HR HPV History of abnormal Pap:  no MMG:  02-26-16 3D/Density C/Neg/BiRads2:[facility] Colonoscopy:  n/a BMD:   n/a  Result  n/a TDaP:  2008 Gardasil:   N/A  Screening Labs:  Hb today: 13.1, Urine today: 2+RBCs--asymptomatic.  Saw urology and had cystoscopy and renal imaging which was normal.    reports that she has never smoked. She has never used smokeless tobacco. She reports that she does not drink alcohol or use drugs.  Past Medical History:  Diagnosis Date  . Hemihypertrophy   . Port wine stain     Past Surgical History:  Procedure Laterality Date  . HYSTEROSCOPY  08-12-06   D & C--resection polyps--benign--Dr. Tresa Resomine    Current Outpatient Prescriptions  Medication Sig Dispense Refill  . Calcium Citrate-Vitamin D (CALCITRATE/VITAMIN D PO) Take by mouth.    Marland Kitchen. ibuprofen (ADVIL,MOTRIN) 200 MG tablet Take 200 mg by mouth every 6 (six) hours as needed.     No current facility-administered medications for this visit.     Family History  Problem Relation Age of Onset  . Thyroid disease Mother   . Colon cancer Father   . Cancer Father [phone]    1.Prostate CA, 2.Lymphoma--related to Agent Orange    ROS:  Pertinent items are noted in HPI.  Otherwise, a comprehensive ROS was negative.  Exam:   BP 110/70 (BP Location: Right Arm, Patient Position: Sitting, Cuff Size: Normal)   Pulse 80   Resp 16   Ht 5'  7.5" (1.715 m)   Wt 170 lb 12.8 oz (77.5 kg)   LMP 09/25/2015 (Exact Date)   BMI 26.36 kg/m     General appearance: alert, cooperative and appears stated age Head: Normocephalic, without obvious abnormality, atraumatic Neck: no adenopathy, supple, symmetrical, trachea midline and thyroid normal to inspection and palpation Lungs: clear to auscultation bilaterally Breasts: normal appearance, no masses or tenderness, No nipple retraction or dimpling, No nipple discharge or bleeding, No axillary or supraclavicular adenopathy Heart: regular rate and rhythm Abdomen: soft, non-tender; no masses, no organomegaly Extremities: extremities normal, atraumatic, no cyanosis or edema Skin: Skin color, texture, turgor normal. No rashes or lesions Lymph nodes: Cervical, supraclavicular, and axillary nodes normal. No abnormal inguinal nodes palpated Neurologic: Grossly normal  Pelvic: External genitalia:  no lesions              Urethra:  normal appearing urethra with no masses, tenderness or lesions              Bartholins and Skenes: normal                 Vagina: normal appearing vagina with normal color and discharge, no lesions              Cervix: no lesions  Pap taken: No. Bimanual Exam:  Uterus:  normal size, contour, position, consistency, mobility, non-tender              Adnexa: no mass, fullness, tenderness              Rectal exam: Yes.  .  Confirms.              Anus:  normal sphincter tone, no lesions  Chaperone was present for exam.  Assessment:   Well woman visit with normal exam. Amenorrhea. Microscopic hematuria. FH colon cancer.   Plan: Yearly mammogram recommended after age 50.  Recommended self breast exam.  Pap and HR HPV as above. Discussed Calcium, Vitamin D, regular exercise program including cardiovascular and weight bearing exercise. Routine labs, FSH and E2. Discussed menopausal changes. She declines HRT.  Urine micro and culture.  She will call  her husband's GI in [place] and schedule colonoscopy.  She will call if she needs a referral. Follow up annually and prn.      After visit summary provided.

## 2016-04-10 ENCOUNTER — Ambulatory Visit (INDEPENDENT_AMBULATORY_CARE_PROVIDER_SITE_OTHER): Payer: BC Managed Care – PPO | Admitting: Obstetrics and Gynecology

## 2016-04-10 ENCOUNTER — Encounter: Payer: Self-pay | Admitting: Obstetrics and Gynecology

## 2016-04-10 VITALS — BP 110/70 | HR 80 | Resp 16 | Ht 67.5 in | Wt 170.8 lb

## 2016-04-10 DIAGNOSIS — R3129 Other microscopic hematuria: Secondary | ICD-10-CM | POA: Diagnosis not present

## 2016-04-10 DIAGNOSIS — Z0000 Encounter for general adult medical examination without abnormal findings: Secondary | ICD-10-CM

## 2016-04-10 DIAGNOSIS — N912 Amenorrhea, unspecified: Secondary | ICD-10-CM | POA: Diagnosis not present

## 2016-04-10 DIAGNOSIS — Z01419 Encounter for gynecological examination (general) (routine) without abnormal findings: Secondary | ICD-10-CM

## 2016-04-10 LAB — CBC
HCT: 39.2 % (ref 35.0–45.0)
Hemoglobin: 13 g/dL (ref 11.7–15.5)
MCH: 28.6 pg (ref 27.0–33.0)
MCHC: 33.2 g/dL (ref 32.0–36.0)
MCV: 86.3 fL (ref 80.0–100.0)
MPV: 10.7 fL (ref 7.5–12.5)
PLATELETS: 223 10*3/uL (ref 140–400)
RBC: 4.54 MIL/uL (ref 3.80–5.10)
RDW: 14.8 % (ref 11.0–15.0)
WBC: 5.7 10*3/uL (ref 3.8–10.8)

## 2016-04-10 LAB — COMPREHENSIVE METABOLIC PANEL
ALK PHOS: 73 U/L (ref 33–115)
ALT: 30 U/L — AB (ref 6–29)
AST: 23 U/L (ref 10–35)
Albumin: 4.5 g/dL (ref 3.6–5.1)
BUN: 11 mg/dL (ref 7–25)
CHLORIDE: 102 mmol/L (ref 98–110)
CO2: 27 mmol/L (ref 20–31)
CREATININE: 0.8 mg/dL (ref 0.50–1.10)
Calcium: 9.8 mg/dL (ref 8.6–10.2)
Glucose, Bld: 87 mg/dL (ref 65–99)
Potassium: 4.4 mmol/L (ref 3.5–5.3)
SODIUM: 140 mmol/L (ref 135–146)
TOTAL PROTEIN: 6.9 g/dL (ref 6.1–8.1)
Total Bilirubin: 0.3 mg/dL (ref 0.2–1.2)

## 2016-04-10 LAB — POCT URINALYSIS DIPSTICK
BILIRUBIN UA: NEGATIVE
Glucose, UA: NEGATIVE
KETONES UA: NEGATIVE
Leukocytes, UA: NEGATIVE
NITRITE UA: NEGATIVE
PH UA: 7
Protein, UA: NEGATIVE
Urobilinogen, UA: NEGATIVE

## 2016-04-10 LAB — LIPID PANEL
Cholesterol: 205 mg/dL — ABNORMAL HIGH (ref <200)
HDL: 40 mg/dL — ABNORMAL LOW (ref >50)
LDL CALC: 131 mg/dL — AB (ref <100)
Total CHOL/HDL Ratio: 5.1 Ratio — ABNORMAL HIGH (ref <5.0)
Triglycerides: 171 mg/dL — ABNORMAL HIGH (ref <150)
VLDL: 34 mg/dL — AB (ref <30)

## 2016-04-10 LAB — TSH: TSH: 2.29 mIU/L

## 2016-04-10 NOTE — Patient Instructions (Signed)

## 2016-04-11 LAB — FOLLICLE STIMULATING HORMONE: FSH: 36 m[IU]/mL

## 2016-04-11 LAB — URINALYSIS, MICROSCOPIC ONLY
BACTERIA UA: NONE SEEN [HPF]
CASTS: NONE SEEN [LPF]
Crystals: NONE SEEN [HPF]
Squamous Epithelial / LPF: NONE SEEN [HPF] (ref <=5)
WBC, UA: NONE SEEN WBC/HPF (ref <=5)
YEAST: NONE SEEN [HPF]

## 2016-04-11 LAB — ESTRADIOL: Estradiol: 29 pg/mL

## 2016-04-11 LAB — URINE CULTURE: ORGANISM ID, BACTERIA: NO GROWTH

## 2016-04-11 MED ORDER — MEDROXYPROGESTERONE ACETATE 10 MG PO TABS: 10.0000 mg | ORAL_TABLET | Freq: Every day | ORAL | 0 refills | Status: DC

## 2016-04-11 NOTE — Addendum Note (Signed)
Addended by: Ardell IsaacsAMUNDSON C SILVA, Debbe BalesBROOK E on: 04/11/2016 04:20 PM   Modules accepted: Orders

## 2016-06-21 ENCOUNTER — Emergency Department (HOSPITAL_COMMUNITY)
Admission: EM | Admit: 2016-06-21 | Discharge: 2016-06-21 | Disposition: A | Discharge status: Home / Self Care | Payer: BC Managed Care – PPO | Attending: Emergency Medicine | Admitting: Emergency Medicine

## 2016-06-21 ENCOUNTER — Encounter (HOSPITAL_COMMUNITY): Payer: Self-pay | Admitting: Emergency Medicine

## 2016-06-21 ENCOUNTER — Emergency Department (HOSPITAL_COMMUNITY): Payer: BC Managed Care – PPO

## 2016-06-21 DIAGNOSIS — R51 Headache: Secondary | ICD-10-CM | POA: Diagnosis present

## 2016-06-21 DIAGNOSIS — G43909 Migraine, unspecified, not intractable, without status migrainosus: Secondary | ICD-10-CM | POA: Diagnosis not present

## 2016-06-21 DIAGNOSIS — G43109 Migraine with aura, not intractable, without status migrainosus: Secondary | ICD-10-CM

## 2016-06-21 LAB — COMPREHENSIVE METABOLIC PANEL
ALT: 48 U/L (ref 14–54)
ANION GAP: 11 (ref 5–15)
AST: 33 U/L (ref 15–41)
Albumin: 4.6 g/dL (ref 3.5–5.0)
Alkaline Phosphatase: 67 U/L (ref 38–126)
BUN: 18 mg/dL (ref 6–20)
CO2: 24 mmol/L (ref 22–32)
Calcium: 9.4 mg/dL (ref 8.9–10.3)
Chloride: 100 mmol/L — ABNORMAL LOW (ref 101–111)
Creatinine, Ser: 0.83 mg/dL (ref 0.44–1.00)
GFR calc non Af Amer: >60 mL/min (ref >60)
Glucose, Bld: 100 mg/dL — ABNORMAL HIGH (ref 65–99)
POTASSIUM: 4.1 mmol/L (ref 3.5–5.1)
SODIUM: 135 mmol/L (ref 135–145)
Total Bilirubin: 0.4 mg/dL (ref 0.3–1.2)
Total Protein: 7.7 g/dL (ref 6.5–8.1)

## 2016-06-21 MED ORDER — DIPHENHYDRAMINE HCL 50 MG/ML IJ SOLN
25.0000 mg | Freq: Once | INTRAMUSCULAR | Status: AC
  Administered 2016-06-21: 25 mg via INTRAVENOUS
  Filled 2016-06-21: qty 1

## 2016-06-21 MED ORDER — PROCHLORPERAZINE EDISYLATE 5 MG/ML IJ SOLN
10.0000 mg | Freq: Once | INTRAMUSCULAR | Status: AC
  Administered 2016-06-21: 10 mg via INTRAVENOUS
  Filled 2016-06-21: qty 2

## 2016-06-21 NOTE — Discharge Instructions (Signed)
Follow-up with neurology 

## 2016-06-21 NOTE — ED Notes (Signed)
Recollect on Lavender and Light [employer] Labs. (Labs Clotted)

## 2016-06-21 NOTE — ED Notes (Signed)
Campos aware of pt status and complaint. Verbal orders placed.

## 2016-06-21 NOTE — ED Triage Notes (Signed)
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

## 2016-06-21 NOTE — ED Provider Notes (Signed)
[facility]-EMERGENCY DEPT Provider Note   CSN: [id number] Arrival date & time: 06/21/16  1707     History   Chief Complaint Chief Complaint  Patient presents with  . Headache    HPI Whitney Flynn is a 51 y.o. female.  51 yo F with a chief complaint of a sided paresthesias. This is been going on for the past 4-1/2 hours. Patient was having a right-sided occipital headache as well. Symptoms of been coming and going. 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 She has had one like this before but it was a long time ago. She is concerned because this is been more severe than she has had recently. She does not see a neurologist for this. She is currently going through menopause.   The history is provided by the patient.  Headache   This is a new problem. The current episode started 3 to 5 hours ago. The problem occurs constantly. The problem has not changed since onset.The headache is associated with nothing. The pain is located in the occipital region. The quality of the pain is described as dull. The pain is at a severity of 5/10. The pain is moderate. The pain does not radiate. Pertinent negatives include no fever, no palpitations, no shortness of breath, no nausea and no vomiting. She has tried nothing for the symptoms. The treatment provided no relief.    Past Medical History:  Diagnosis Date  . Hemihypertrophy   . Port wine stain     Patient Active Problem List   Diagnosis Date Noted  . Microscopic hematuria 01/21/2014    Past Surgical History:  Procedure Laterality Date  . HYSTEROSCOPY  08-12-06   D & C--resection polyps--benign--Dr. Tresa Res    OB History    Gravida Para Term Preterm AB Living   2 2 2     2    SAB TAB Ectopic Multiple Live Births                   Home Medications    Prior to Admission medications   Medication Sig Start Date End Date Taking? Authorizing Provider  Calcium Citrate-Vitamin D (CALCITRATE/VITAMIN D PO) Take 1 tablet by mouth  daily.    Yes Historical Provider, MD  ibuprofen (ADVIL,MOTRIN) 200 MG tablet Take 200 mg by mouth every 6 (six) hours as needed for moderate pain.    Yes Historical Provider, MD  medroxyPROGESTERone (PROVERA) 10 MG tablet Take 1 tablet (10 mg total) by mouth daily. Patient not taking: Reported on 06/21/2016 04/11/16   Patton Salles, MD    Family History Family History  Problem Relation Age of Onset  . Thyroid disease Mother   . Colon cancer Father   . Cancer Father 20    1.Prostate CA, 2.Lymphoma--related to Agent Orange    Social History Social History  Substance Use Topics  . Smoking status: Never Smoker  . Smokeless tobacco: Never Used  . Alcohol use No     Allergies   Patient has no known allergies.   Review of Systems Review of Systems  Constitutional: Negative for chills and fever.  HENT: Negative for congestion and rhinorrhea.   Eyes: Negative for redness and visual disturbance.  Respiratory: Negative for shortness of breath and wheezing.   Cardiovascular: Negative for chest pain and palpitations.  Gastrointestinal: Negative for nausea and vomiting.  Genitourinary: Negative for dysuria and urgency.  Musculoskeletal: Negative for arthralgias and myalgias.  Skin: Negative for pallor and wound.  Neurological: Positive for numbness and headaches. Negative for dizziness.     Physical Exam Updated Vital Signs BP 160/72 (BP Location: Right Arm)   Pulse 89   Temp 98.1 F (36.7 C) (Oral)   Resp 18   Ht 5\' 8"  (1.727 m)   Wt 170 lb (77.1 kg)   SpO2 100%   BMI 25.85 kg/m   Physical Exam  Constitutional: She is oriented to person, place, and time. She appears well-developed and well-nourished. No distress.  HENT:  Head: Normocephalic and atraumatic.  Eyes: EOM are normal. Pupils are equal, round, and reactive to light.  Neck: Normal range of motion. Neck supple.  Cardiovascular: Normal rate and regular rhythm.  Exam reveals no gallop and no friction  rub.   No murmur heard. Pulmonary/Chest: Effort normal. She has no wheezes. She has no rales.  Abdominal: Soft. She exhibits no distension. There is no tenderness.  Musculoskeletal: She exhibits no edema or tenderness.  Neurological: She is alert and oriented to person, place, and time. She has normal strength. No cranial nerve deficit or sensory deficit. She displays a negative Romberg sign. Coordination and gait normal. GCS eye subscore is 4. GCS verbal subscore is 5. GCS motor subscore is 6. She displays no Babinski's sign on the right side. She displays no Babinski's sign on the left side.  Reflex Scores:      Tricep reflexes are 2+ on the right side and 2+ on the left side.      Bicep reflexes are 2+ on the right side and 2+ on the left side.      Brachioradialis reflexes are 2+ on the right side and 2+ on the left side.      Patellar reflexes are 2+ on the right side and 2+ on the left side.      Achilles reflexes are 2+ on the right side and 2+ on the left side. Skin: Skin is warm and dry. She is not diaphoretic.  Psychiatric: She has a normal mood and affect. Her behavior is normal.  Nursing note and vitals reviewed.    ED Treatments / Results  Labs (all labs ordered are listed, but only abnormal results are displayed) Labs Reviewed  COMPREHENSIVE METABOLIC PANEL - Abnormal; Notable for the following:       Result Value   Chloride 100 (*)    Glucose, Bld 100 (*)    All other components within normal limits  CBC WITH DIFFERENTIAL/PLATELET    EKG  EKG Interpretation None       Radiology Ct Head Wo Contrast  Result Date: 06/21/2016 CLINICAL DATA:  Headache and slurred speech. Blurred vision and numbness of the left side of the body aches onset at 1615 hours. EXAM: CT HEAD WITHOUT CONTRAST TECHNIQUE: Contiguous axial images were obtained from the base of the skull through the vertex without intravenous contrast. COMPARISON:  None. FINDINGS: BRAIN: The ventricles and sulci  are normal. No intraparenchymal hemorrhage, mass effect nor midline shift. No acute large vascular territory infarcts. Grey-white matter distinction is maintained. The basal ganglia are unremarkable. No abnormal extra-axial fluid collections. Basal cisterns are not effaced and midline. The brainstem and cerebellar hemispheres are without acute abnormalities. VASCULAR: Unremarkable. SKULL/SOFT TISSUES: No skull fracture. No significant soft tissue swelling. ORBITS/SINUSES: The included ocular globes and orbital contents are normal.The mastoid air cells are clear. The included paranasal sinuses are well-aerated. OTHER: None. IMPRESSION: No acute intracranial abnormality. Electronically Signed   By: Tollie Ethavid  Kwon M.D.   On:  06/21/2016 17:50    Procedures Procedures (including critical care time)  Medications Ordered in ED Medications  prochlorperazine (COMPAZINE) injection 10 mg (10 mg Intravenous Given 06/21/16 2135)  diphenhydrAMINE (BENADRYL) injection 25 mg (25 mg Intravenous Given 06/21/16 2136)     Initial Impression / Assessment and Plan / ED Course  I have reviewed the triage vital signs and the nursing notes.  Pertinent labs & imaging results that were available during my care of the patient were reviewed by me and considered in my medical decision making (see chart for details).     51 yo F With a chief complaints of the headache. Sounds like a complicated migraine based on history. Neuro exam with no findings. Will get a headache cocktail. Discussed with neurology, Dr. Georg Ruddle, as this is in a distribution she has had previously with a headache he sees no need for further imaging.  Patient with resolution of symptoms completely with headache cocktail.  Neuro follow up.    I have discussed the diagnosis/risks/treatment options with the patient and family and believe the pt to be eligible for discharge home to follow-up with Neuro. We also discussed returning to the ED immediately if new or  worsening sx occur. We discussed the sx which are most concerning (e.g., sudden worsening pain, fever, inability to tolerate by mouth) that necessitate immediate return. Medications administered to the patient during their visit and any new prescriptions provided to the patient are listed below.  Medications given during this visit Medications  prochlorperazine (COMPAZINE) injection 10 mg (10 mg Intravenous Given 06/21/16 2135)  diphenhydrAMINE (BENADRYL) injection 25 mg (25 mg Intravenous Given 06/21/16 2136)     The patient appears reasonably screen and/or stabilized for discharge and I doubt any other medical condition or other [facility] requiring further screening, evaluation, or treatment in the ED at this time prior to discharge.    Final Clinical Impressions(s) / ED Diagnoses   Final diagnoses:  Complicated migraine    New Prescriptions Discharge Medication List as of 06/21/2016 10:29 PM       Melene Plan, DO 06/22/16 0015

## 2016-06-21 NOTE — ED Notes (Signed)
E signature pad not working 

## 2016-08-12 ENCOUNTER — Ambulatory Visit (INDEPENDENT_AMBULATORY_CARE_PROVIDER_SITE_OTHER): Payer: BC Managed Care – PPO | Admitting: Neurology

## 2016-08-12 ENCOUNTER — Encounter: Payer: Self-pay | Admitting: Neurology

## 2016-08-12 VITALS — BP 118/80 | HR 86 | Ht 67.0 in | Wt 170.0 lb

## 2016-08-12 DIAGNOSIS — G43109 Migraine with aura, not intractable, without status migrainosus: Secondary | ICD-10-CM

## 2016-08-12 DIAGNOSIS — Q872 Congenital malformation syndromes predominantly involving limbs: Secondary | ICD-10-CM | POA: Insufficient documentation

## 2016-08-12 NOTE — Progress Notes (Signed)
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  History supplemented by ED note.  She first started getting migraines at 74 or 51 years old.  She describes left sided numbness and tingling beginning in the foot and traveling up to the head.  It was accompanied by severe headache with nausea and vomiting.  She underwent an extensive workup and was on Tegretol for many years to treat migraine versus possible seizure.  She stopped the Tegretol at age 29 and did not have a recurrent spell.  In her mid 64s, she developed ocular migraines, described as visual aura with peripheral vision loss, without headache, lasting 20 minutes and occurring 2 to 3 times a year, usually during periods of higher stress such as around December.  On 06/21/16, she developed initial photophobia followed by strange left hand/arm numbness and tingling dull 5/10 throbbing bi-temporal headache. It was not associated with nausea, vomiting or unilateral weakness.  It lasted about an hour.  Ibuprofen was effective.  CT of head was personally reviewed and was unremarkable.  CMP was unremarkable.  She was diagnosed with a complicated migraine.   She has not had a recurrent spell.  She denies occasional stress.  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  Ibuprofen is always effective.  She drinks coffee daily.  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  PAST MEDICAL HISTORY: Past Medical History:  Diagnosis Date  . Hemihypertrophy   . Port wine stain     PAST SURGICAL HISTORY: Past Surgical History:  Procedure Laterality Date  . HYSTEROSCOPY  08-12-06   D &  C--resection polyps--benign--Dr. Tresa Res    MEDICATIONS: Current Outpatient Prescriptions on File Prior to Visit  Medication Sig Dispense Refill  . Calcium Citrate-Vitamin D (CALCITRATE/VITAMIN D PO) Take 1 tablet by mouth daily.     Marland Kitchen ibuprofen (ADVIL,MOTRIN) 200 MG tablet Take 200 mg by mouth every 6 (six) hours as needed for moderate pain.      No current facility-administered medications on file prior to visit.     ALLERGIES: No Known Allergies  FAMILY HISTORY: Family History  Problem Relation Age of Onset  . Thyroid disease Mother   . Colon cancer Father   . Cancer Father 22    1.Prostate CA, 2.Lymphoma--related to Agent Orange    SOCIAL HISTORY: Social History   Social History  . Marital status: Married    Spouse name: N/A  . Number of children: N/A  . Years of education: N/A   Occupational History  . Not on file.   Social History Main Topics  . Smoking status: Never Smoker  . Smokeless tobacco: Never Used  . Alcohol use No  . Drug use: No  . Sexual activity: Yes    Partners: Male    Birth control/ protection: Other-see comments     Comment: vasectomy   Other Topics Concern  . Not on file   Social History Narrative  . No narrative on file    REVIEW OF SYSTEMS: Constitutional: No fevers, chills, or sweats, no generalized fatigue, change in appetite Eyes: No visual changes, double vision, eye pain Ear, nose and throat: No hearing loss, ear pain, nasal congestion, sore throat  Cardiovascular: No chest pain, palpitations Respiratory:  No shortness of breath at rest or with exertion, wheezes GastrointestinaI: No nausea, vomiting, diarrhea, abdominal pain, fecal incontinence Genitourinary:  No dysuria, urinary retention or frequency Musculoskeletal:  No neck pain, back pain Integumentary: port wine stain  Neurological: as above Psychiatric: No depression, insomnia, anxiety Endocrine: No palpitations, fatigue, diaphoresis, mood swings, change in appetite,  change in weight, increased thirst Hematologic/Lymphatic:  No purpura, petechiae. Allergic/Immunologic: no itchy/runny eyes, nasal congestion, recent allergic reactions, rashes  PHYSICAL EXAM: Vitals:   08/12/16 1247  BP: 118/80  Pulse: 86   General: No acute distress.  Patient appears well-groomed.  Head:  Normocephalic/atraumatic Eyes:  fundi examined but not visualized Neck: supple, no paraspinal tenderness, full range of motion Back: No paraspinal tenderness Heart: regular rate and rhythm Lungs: Clear to auscultation bilaterally. Vascular: No carotid bruits. Neurological Exam: Mental status: alert and oriented to person, place, and time, recent and remote memory intact, fund of knowledge intact, attention and concentration intact, speech fluent and not dysarthric, language intact. Cranial nerves: CN I: not tested CN II: pupils equal, round and reactive to light, visual fields intact CN III, IV, VI:  full range of motion, no nystagmus, no ptosis CN V: facial sensation intact CN VII: upper and lower face symmetric CN VIII: hearing intact CN IX, X: gag intact, uvula midline CN XI: sternocleidomastoid and trapezius muscles intact CN XII: tongue midline Bulk & Tone: normal, no fasciculations. Motor:  5/5 throughout  Sensation: temperature and vibration sensation intact. Deep Tendon Reflexes:  2+ throughout, toes downgoing.  Finger to nose testing:  Without dysmetria.  Heel to shin:  Without dysmetria.  Gait:  Normal station and stride.  Able to turn and tandem walk. Romberg negative.  IMPRESSION: Migraine with aura Ocular migraines  PLAN: History consistent with migraine.  No further workup or treatment warranted. Follow up as needed.  Thank you for allowing me to take part in the care of this patient.  Shon Millet, DO

## 2016-08-12 NOTE — Patient Instructions (Signed)
Migraine Headache A migraine headache is an intense, throbbing pain on one side or both sides of the head. Migraines may also cause other symptoms, such as nausea, vomiting, and sensitivity to light and noise. What are the causes? Doing or taking certain things may also trigger migraines, such as:  Alcohol.  Smoking.  Medicines, such as: ? Medicine used to treat chest pain (nitroglycerine). ? Birth control pills. ? Estrogen pills. ? Certain blood pressure medicines.  Aged cheeses, chocolate, or caffeine.  Foods or drinks that contain nitrates, glutamate, aspartame, or tyramine.  Physical activity.  Other things that may trigger a migraine include:  Menstruation.  Pregnancy.  Hunger.  Stress, lack of sleep, too much sleep, or fatigue.  Weather changes.  What increases the risk? The following factors may make you more likely to experience migraine headaches:  Age. Risk increases with age.  Family history of migraine headaches.  Being Caucasian.  Depression and anxiety.  Obesity.  Being a woman.  Having a hole in the heart (patent foramen ovale) or other heart problems.  What are the signs or symptoms? The main symptom of this condition is pulsating or throbbing pain. Pain may:  Happen in any area of the head, such as on one side or both sides.  Interfere with daily activities.  Get worse with physical activity.  Get worse with exposure to bright lights or loud noises.  Other symptoms may include:  Nausea.  Vomiting.  Dizziness.  General sensitivity to bright lights, loud noises, or smells.  Before you get a migraine, you may get warning signs that a migraine is developing (aura). An aura may include:  Seeing flashing lights or having blind spots.  Seeing bright spots, halos, or zigzag lines.  Having tunnel vision or blurred vision.  Having numbness or a tingling feeling.  Having trouble talking.  Having muscle weakness.  How is this  diagnosed? A migraine headache can be diagnosed based on:  Your symptoms.  A physical exam.  Tests, such as CT scan or MRI of the head. These imaging tests can help rule out other causes of headaches.  Taking fluid from the spine (lumbar puncture) and analyzing it (cerebrospinal fluid analysis, or CSF analysis).  How is this treated? A migraine headache is usually treated with medicines that:  Relieve pain.  Relieve nausea.  Prevent migraines from coming back.  Treatment may also include:  Acupuncture.  Lifestyle changes like avoiding foods that trigger migraines.  Follow these instructions at home: Medicines  Take over-the-counter and prescription medicines only as told by your health care provider.  Do not drive or use heavy machinery while taking prescription pain medicine.  To prevent or treat constipation while you are taking prescription pain medicine, your health care provider may recommend that you: ? Drink enough fluid to keep your urine clear or pale yellow. ? Take over-the-counter or prescription medicines. ? Eat foods that are high in fiber, such as fresh fruits and vegetables, whole grains, and beans. ? Limit foods that are high in fat and processed sugars, such as fried and sweet foods. Lifestyle  Avoid alcohol use.  Do not use any products that contain nicotine or tobacco, such as cigarettes and e-cigarettes. If you need help quitting, ask your health care provider.  Get at least 8 hours of sleep every night.  Limit your stress. General instructions   Keep a journal to find out what may trigger your migraine headaches. For example, write down: ? What you eat and   drink. ? How much sleep you get. ? Any change to your diet or medicines.  If you have a migraine: ? Avoid things that make your symptoms worse, such as bright lights. ? It may help to lie down in a dark, quiet room. ? Do not drive or use heavy machinery. ? Ask your health care provider  what activities are safe for you while you are experiencing symptoms.  Keep all follow-up visits as told by your health care provider. This is important. Contact a health care provider if:  You develop symptoms that are different or more severe than your usual migraine symptoms. Get help right away if:  Your migraine becomes severe.  You have a fever.  You have a stiff neck.  You have vision loss.  Your muscles feel weak or like you cannot control them.  You start to lose your balance often.  You develop trouble walking.  You faint. This information is not intended to replace advice given to you by your health care provider. Make sure you discuss any questions you have with your health care provider. Document Released: 04/08/2005 Document Revised: 10/27/2015 Document Reviewed: 09/25/2015 Elsevier Interactive Patient Education  2017 [employer]   

## 2016-12-21 DEATH — deceased

## 2016-12-25 ENCOUNTER — Telehealth: Payer: Self-pay | Admitting: *Deleted

## 2016-12-25 NOTE — Telephone Encounter (Addendum)
Changing patient's status to deceased in our system. Routing to provider of the day for review. Sending obituary to HIM for scanning into the patient's chart.

## 2016-12-25 NOTE — Telephone Encounter (Signed)
Friend of patient that is also a patient of this practice, conatcted office and notified office of patient's unexpected death. (caller identified herself as Mataya's  best friend and had medical need so she wanted to let us know about her death.)  Ileene HutchinsonObituary [facility]. Patient died on 12-12-2016. All information reviewed by Dr Edward JollySilva. Sympathy card mailed to family. All future appointments canceled.

## 2016-12-26 NOTE — Telephone Encounter (Signed)
Routing to provider for final review. Patient agreeable to disposition. Will close encounter.     

## 2017-05-02 ENCOUNTER — Ambulatory Visit: Payer: BC Managed Care – PPO | Admitting: Obstetrics and Gynecology
